# Patient Record
Sex: Male | Born: 1955 | Race: Black or African American | Hispanic: No | Marital: Married | Smoking: Current some day smoker
Health system: Southern US, Community
[De-identification: ages and names within clinical notes are randomized; demographics above are authoritative.]

## PROBLEM LIST (undated history)

## (undated) DIAGNOSIS — N400 Enlarged prostate without lower urinary tract symptoms: Secondary | ICD-10-CM

## (undated) DIAGNOSIS — I10 Essential (primary) hypertension: Secondary | ICD-10-CM

## (undated) DIAGNOSIS — M1990 Unspecified osteoarthritis, unspecified site: Secondary | ICD-10-CM

---

## 2018-08-19 ENCOUNTER — Other Ambulatory Visit: Payer: Self-pay

## 2018-08-19 ENCOUNTER — Emergency Department (HOSPITAL_BASED_OUTPATIENT_CLINIC_OR_DEPARTMENT_OTHER)
Admission: EM | Admit: 2018-08-19 | Discharge: 2018-08-19 | Disposition: A | Discharge status: Home / Self Care | Payer: Medicaid Other | Attending: Emergency Medicine | Admitting: Emergency Medicine

## 2018-08-19 ENCOUNTER — Emergency Department (HOSPITAL_BASED_OUTPATIENT_CLINIC_OR_DEPARTMENT_OTHER): Payer: Medicaid Other

## 2018-08-19 ENCOUNTER — Encounter (HOSPITAL_BASED_OUTPATIENT_CLINIC_OR_DEPARTMENT_OTHER): Payer: Self-pay | Admitting: Adult Health

## 2018-08-19 DIAGNOSIS — S3992XA Unspecified injury of lower back, initial encounter: Secondary | ICD-10-CM | POA: Diagnosis present

## 2018-08-19 DIAGNOSIS — Y9389 Activity, other specified: Secondary | ICD-10-CM | POA: Insufficient documentation

## 2018-08-19 DIAGNOSIS — S22080A Wedge compression fracture of T11-T12 vertebra, initial encounter for closed fracture: Secondary | ICD-10-CM

## 2018-08-19 DIAGNOSIS — I10 Essential (primary) hypertension: Secondary | ICD-10-CM | POA: Diagnosis not present

## 2018-08-19 DIAGNOSIS — F17290 Nicotine dependence, other tobacco product, uncomplicated: Secondary | ICD-10-CM | POA: Insufficient documentation

## 2018-08-19 DIAGNOSIS — Y999 Unspecified external cause status: Secondary | ICD-10-CM | POA: Diagnosis not present

## 2018-08-19 DIAGNOSIS — Y92410 Unspecified street and highway as the place of occurrence of the external cause: Secondary | ICD-10-CM | POA: Insufficient documentation

## 2018-08-19 HISTORY — DX: Essential (primary) hypertension: I10

## 2018-08-19 HISTORY — DX: Benign prostatic hyperplasia without lower urinary tract symptoms: N40.0

## 2018-08-19 HISTORY — DX: Unspecified osteoarthritis, unspecified site: M19.90

## 2018-08-19 MED ORDER — CYCLOBENZAPRINE HCL 5 MG PO TABS: 5.0000 mg | ORAL_TABLET | Freq: Three times a day (TID) | ORAL | 0 refills | Status: AC | PRN

## 2018-08-19 MED ORDER — ACETAMINOPHEN 325 MG PO TABS
650.0000 mg | ORAL_TABLET | Freq: Once | ORAL | Status: AC
  Administered 2018-08-19: 650 mg via ORAL
  Filled 2018-08-19: qty 2

## 2018-08-19 NOTE — Discharge Instructions (Addendum)
Do not drive or operate heavy machinery while taking the Flexeril.  Do not combine with other medicines or alcohol.  If you develop worsening, recurrent, or continued back pain, numbness or weakness in the legs, incontinence of your bowels or bladders, numbness of your buttocks, fever, abdominal pain, or any other new/concerning symptoms then return to the ER for evaluation.

## 2018-08-19 NOTE — ED Notes (Signed)
ED Provider at bedside. 
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
